# Patient Record
Sex: Female | Born: 2000 | Race: White | Hispanic: No | Marital: Single | Smoking: Never smoker
Health system: Southern US, Community
[De-identification: ages and names within clinical notes are randomized; demographics above are authoritative.]

## PROBLEM LIST (undated history)

## (undated) DIAGNOSIS — G43909 Migraine, unspecified, not intractable, without status migrainosus: Secondary | ICD-10-CM

## (undated) DIAGNOSIS — R000 Tachycardia, unspecified: Secondary | ICD-10-CM

## (undated) HISTORY — DX: Migraine, unspecified, not intractable, without status migrainosus: G43.909

## (undated) HISTORY — PX: NO PAST SURGERIES: SHX2092

## (undated) HISTORY — DX: Tachycardia, unspecified: R00.0

---
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
## 2022-01-16 NOTE — Progress Notes (Signed)
This encounter was created in error - please disregard.

## 2022-06-05 ENCOUNTER — Emergency Department (HOSPITAL_BASED_OUTPATIENT_CLINIC_OR_DEPARTMENT_OTHER)
Admission: EM | Admit: 2022-06-05 | Discharge: 2022-06-05 | Disposition: A | Discharge status: Home / Self Care | Payer: No Typology Code available for payment source | Attending: Emergency Medicine | Admitting: Emergency Medicine

## 2022-06-05 ENCOUNTER — Emergency Department (HOSPITAL_BASED_OUTPATIENT_CLINIC_OR_DEPARTMENT_OTHER): Payer: No Typology Code available for payment source | Admitting: Radiology

## 2022-06-05 ENCOUNTER — Emergency Department (HOSPITAL_BASED_OUTPATIENT_CLINIC_OR_DEPARTMENT_OTHER): Payer: No Typology Code available for payment source

## 2022-06-05 ENCOUNTER — Encounter (HOSPITAL_BASED_OUTPATIENT_CLINIC_OR_DEPARTMENT_OTHER): Payer: Self-pay | Admitting: *Deleted

## 2022-06-05 ENCOUNTER — Other Ambulatory Visit: Payer: Self-pay

## 2022-06-05 DIAGNOSIS — R002 Palpitations: Secondary | ICD-10-CM | POA: Diagnosis not present

## 2022-06-05 DIAGNOSIS — R0602 Shortness of breath: Secondary | ICD-10-CM | POA: Diagnosis not present

## 2022-06-05 DIAGNOSIS — Z91040 Latex allergy status: Secondary | ICD-10-CM | POA: Insufficient documentation

## 2022-06-05 DIAGNOSIS — Z79899 Other long term (current) drug therapy: Secondary | ICD-10-CM | POA: Diagnosis not present

## 2022-06-05 DIAGNOSIS — R109 Unspecified abdominal pain: Secondary | ICD-10-CM | POA: Diagnosis not present

## 2022-06-05 DIAGNOSIS — R000 Tachycardia, unspecified: Secondary | ICD-10-CM | POA: Diagnosis present

## 2022-06-05 LAB — URINALYSIS, ROUTINE W REFLEX MICROSCOPIC
Bilirubin Urine: NEGATIVE
Glucose, UA: NEGATIVE mg/dL
Hgb urine dipstick: NEGATIVE
Ketones, ur: NEGATIVE mg/dL
Leukocytes,Ua: NEGATIVE
Nitrite: NEGATIVE
Protein, ur: NEGATIVE mg/dL
Specific Gravity, Urine: 1.01 (ref 1.005–1.030)
pH: 6.5 (ref 5.0–8.0)

## 2022-06-05 LAB — BASIC METABOLIC PANEL
Anion gap: 11 (ref 5–15)
BUN: 9 mg/dL (ref 6–20)
CO2: 24 mmol/L (ref 22–32)
Calcium: 9.4 mg/dL (ref 8.9–10.3)
Chloride: 103 mmol/L (ref 98–111)
Creatinine, Ser: 0.78 mg/dL (ref 0.44–1.00)
GFR, Estimated: >60 mL/min (ref >60)
Glucose, Bld: 99 mg/dL (ref 70–99)
Potassium: 3.6 mmol/L (ref 3.5–5.1)
Sodium: 138 mmol/L (ref 135–145)

## 2022-06-05 LAB — TROPONIN I (HIGH SENSITIVITY): Troponin I (High Sensitivity): <2 ng/L (ref <18)

## 2022-06-05 LAB — TSH: TSH: 2.047 u[IU]/mL (ref 0.350–4.500)

## 2022-06-05 LAB — CBC
HCT: 43.4 % (ref 36.0–46.0)
Hemoglobin: 15 g/dL (ref 12.0–15.0)
MCH: 30.5 pg (ref 26.0–34.0)
MCHC: 34.6 g/dL (ref 30.0–36.0)
MCV: 88.2 fL (ref 80.0–100.0)
Platelets: 477 10*3/uL — ABNORMAL HIGH (ref 150–400)
RBC: 4.92 MIL/uL (ref 3.87–5.11)
RDW: 12.7 % (ref 11.5–15.5)
WBC: 9 10*3/uL (ref 4.0–10.5)
nRBC: 0 % (ref 0.0–0.2)

## 2022-06-05 LAB — HEPATIC FUNCTION PANEL
ALT: 15 U/L (ref 0–44)
AST: 20 U/L (ref 15–41)
Albumin: 4.5 g/dL (ref 3.5–5.0)
Alkaline Phosphatase: 55 U/L (ref 38–126)
Bilirubin, Direct: <0.1 mg/dL (ref 0.0–0.2)
Total Bilirubin: 0.4 mg/dL (ref 0.3–1.2)
Total Protein: 7.2 g/dL (ref 6.5–8.1)

## 2022-06-05 LAB — T4, FREE: Free T4: 0.94 ng/dL (ref 0.61–1.12)

## 2022-06-05 LAB — LIPASE, BLOOD: Lipase: 10 U/L — ABNORMAL LOW (ref 11–51)

## 2022-06-05 LAB — PREGNANCY, URINE: Preg Test, Ur: NEGATIVE

## 2022-06-05 LAB — LACTIC ACID, PLASMA: Lactic Acid, Venous: 1.1 mmol/L (ref 0.5–1.9)

## 2022-06-05 MED ORDER — SODIUM CHLORIDE 0.9 % IV BOLUS
2000.0000 mL | Freq: Once | INTRAVENOUS | Status: AC
  Administered 2022-06-05: 2000 mL via INTRAVENOUS

## 2022-06-05 MED ORDER — METOPROLOL TARTRATE 25 MG PO TABS: 12.5000 mg | ORAL_TABLET | Freq: Two times a day (BID) | ORAL | 0 refills | Status: DC | PRN

## 2022-06-05 MED ORDER — IOHEXOL 350 MG/ML SOLN
100.0000 mL | Freq: Once | INTRAVENOUS | Status: AC | PRN
  Administered 2022-06-05: 80 mL via INTRAVENOUS

## 2022-06-05 NOTE — ED Notes (Signed)
Two extra SST drawn and sent to lab at their request

## 2022-06-05 NOTE — Discharge Instructions (Addendum)
It was a pleasure taking care of you here in the emergency department  As discussed in the room we have started you on a low-dose beta-blocker.  This will be 1/2 tablet of the medication I prescribed.  You will take 1 dose in the morning to start with.  If you have persistently elevated heart rates in the evening you may take an additional dose.  Make sure to drink plenty of fluids, stop any caffeinated beverages, tobacco use or illicit substances if you use any.  I have placed a referral to the cardiologist.  They should call you within the week to schedule an appointment.  If you do not hear back from them by Wednesday please call to schedule an appointment.  At that time please let them know you are seen in the emergency department.  Return for new or worsening symptoms

## 2022-06-05 NOTE — ED Provider Notes (Signed)
[place] EMERGENCY DEPARTMENT AT [facility] Provider Note   CSN: [id number] Arrival date & time: 06/05/22  1411    History  Chief Complaint  Patient presents with   Tachycardia    Kari Villa is a 22 y.o. female with no significant past medical history here for evaluation of tachycardia.  Has been going on over the last 2 weeks.  Has been seen in urgent care multiple times per patient for similar.  Was recently bit by a tick 2 weeks ago, 1 to right upper back, 1 to right forearm.  She thinks 1 was a dog tick and the other 1 a take that.  Re[place] spotted fever.  She was seen by urgent care at that time and was started on doxycycline.  No fever, headache, nausea, vomiting.  She has abdominal pain and was constipated after starting the doxycycline she has been using mag citrate which has helped.  Now having normal bowel movements.  She denies any rashes or lesions.  States she has had persistent tachycardia, worse with up and moving around.  No history of similar.  No thyroid problems.  Drinks 1-2 sodas, Dr. Reino Kent daily.  No illicit substance use.  No stimulant drugs.  Has some shortness of breath and palpitations.  States her heart rate will come down to the 110-115 while laying there however up and moving around at work her heart rate will be in the 160s.  She denies any syncope.  No pain or swelling to lower extremities.  Denies additional aggravating or alleviating factors.  Does not have a PCP  HPI     Home Medications Prior to Admission medications   Medication Sig Start Date End Date Taking? Authorizing Provider  metoprolol tartrate (LOPRESSOR) 25 MG tablet Take 0.5 tablets (12.5 mg total) by mouth every 12 (twelve) hours as needed. 06/05/22  Yes Grettel Rames A, PA-C  JUNEL FE 1.5/30 1.5-30 MG-MCG tablet TAKE 1 TABLET BY MOUTH DAILY. TAKE ACTIVE PILLS ONLY, SKIPPING PLACEBOS PILLS 10/13/21   Wyline Beady A, NP      Allergies    Levofloxacin, Latex,  Penicillins, and Sulfa antibiotics    Review of Systems   Review of Systems  Constitutional: Negative.   HENT: Negative.    Respiratory:  Positive for shortness of breath.   Cardiovascular:  Positive for palpitations. Negative for chest pain and leg swelling.  Gastrointestinal: Negative.   Genitourinary: Negative.   Musculoskeletal: Negative.   Skin: Negative.   Neurological: Negative.   All other systems reviewed and are negative.   Physical Exam Updated Vital Signs BP (!) 105/90   Pulse (!) 103   Temp 99.1 F (37.3 C) (Oral)   Resp (!) 21   SpO2 98%  Physical Exam Vitals and nursing note reviewed.  Constitutional:      General: She is not in acute distress.    Appearance: She is well-developed. She is not ill-appearing or toxic-appearing.  HENT:     Head: Normocephalic and atraumatic.     Nose: Nose normal.     Mouth/Throat:     Mouth: Mucous membranes are moist.  Eyes:     Pupils: Pupils are equal, round, and reactive to light.  Cardiovascular:     Rate and Rhythm: Regular rhythm. Tachycardia present.     Pulses: Normal pulses.     Heart sounds: Normal heart sounds.  Pulmonary:     Effort: Pulmonary effort is normal. No respiratory distress.     Breath sounds: Normal breath  sounds.  Abdominal:     General: Bowel sounds are normal. There is no distension.     Palpations: Abdomen is soft.  Musculoskeletal:        General: No swelling, tenderness, deformity or signs of injury. Normal range of motion.     Cervical back: Normal range of motion.     Right lower leg: No edema.     Left lower leg: No edema.  Skin:    General: Skin is warm and dry.     Capillary Refill: Capillary refill takes less than 2 seconds.     Findings: No lesion or rash.  Neurological:     General: No focal deficit present.     Mental Status: She is alert and oriented to person, place, and time.     Motor: No weakness.     Gait: Gait normal.  Psychiatric:        Mood and Affect: Mood  normal.     ED Results / Procedures / Treatments   Labs (all labs ordered are listed, but only abnormal results are displayed) Labs Reviewed  CBC - Abnormal; Notable for the following components:      Result Value   Platelets 477 (*)    All other components within normal limits  LIPASE, BLOOD - Abnormal; Notable for the following components:   Lipase 10 (*)    All other components within normal limits  URINALYSIS, ROUTINE W REFLEX MICROSCOPIC - Abnormal; Notable for the following components:   Color, Urine COLORLESS (*)    All other components within normal limits  BASIC METABOLIC PANEL  PREGNANCY, URINE  TSH  HEPATIC FUNCTION PANEL  LACTIC ACID, PLASMA  T4, FREE  ROCKY MTN SPOTTED FVR ABS PNL(IGG+IGM)  LYME DISEASE SEROLOGY W/REFLEX  SPOTTED FEVER GROUP ANTIBODIES  TROPONIN I (HIGH SENSITIVITY)  TROPONIN I (HIGH SENSITIVITY)    EKG EKG Interpretation  Date/Time:  Friday Jun 05 2022 14:19:06 EDT Ventricular Rate:  132 PR Interval:  130 QRS Duration: 72 QT Interval:  302 QTC Calculation: 447 R Axis:   90 Text Interpretation: Sinus tachycardia Rightward axis T wave abnormality, consider inferior ischemia T wave abnormality, consider anterior ischemia Abnormal ECG No previous ECGs available Confirmed by Vonita Moss [phone]) on 06/05/2022 3:16:11 PM  Radiology CT Angio Chest PE W and/or Wo Contrast  Result Date: 06/05/2022 CLINICAL DATA:  Pulmonary embolism suspected, shortness of breath, tachycardia EXAM: CT ANGIOGRAPHY CHEST WITH CONTRAST TECHNIQUE: Multidetector CT imaging of the chest was performed using the standard protocol during bolus administration of intravenous contrast. Multiplanar CT image reconstructions and MIPs were obtained to evaluate the vascular anatomy. RADIATION DOSE REDUCTION: This exam was performed according to the departmental dose-optimization program which includes automated exposure control, adjustment of the mA and/or kV according to patient  size and/or use of iterative reconstruction technique. CONTRAST:  80mL OMNIPAQUE IOHEXOL 350 MG/ML SOLN COMPARISON:  None Available. FINDINGS: Cardiovascular: Satisfactory opacification of the pulmonary arteries to the segmental level. No evidence of pulmonary embolism. Normal heart size. No pericardial effusion. Mediastinum/Nodes: No enlarged mediastinal, hilar, or axillary lymph nodes. Thyroid gland, trachea, and esophagus demonstrate no significant findings. Likely residual thymus in the anterior mediastinum. Lungs/Pleura: Lungs are clear. No pleural effusion or pneumothorax. Upper Abdomen: No acute abnormality. Musculoskeletal: No chest wall abnormality. No acute osseous abnormality. Hemangioma in the T5 vertebral body. S-shaped curvature of the thoracolumbar spine. Review of the MIP images confirms the above findings. IMPRESSION: No evidence of pulmonary embolism or other acute intrathoracic  process. Electronically Signed   By: Wiliam Ke M.D.   On: 06/05/2022 16:14   DG Chest 2 View  Result Date: 06/05/2022 CLINICAL DATA:  Tachycardia, fever, fatigue, recent tick bite EXAM: CHEST - 2 VIEW COMPARISON:  None Available. FINDINGS: The heart size and mediastinal contours are within normal limits. Both lungs are clear. The visualized skeletal structures are unremarkable. IMPRESSION: No active cardiopulmonary disease. Electronically Signed   By: Ernie Avena M.D.   On: 06/05/2022 14:53    Procedures Procedures    Medications Ordered in ED Medications  sodium chloride 0.9 % bolus 2,000 mL (0 mLs Intravenous Stopped 06/05/22 1705)  iohexol (OMNIPAQUE) 350 MG/ML injection 100 mL (80 mLs Intravenous Contrast Given 06/05/22 1602)   ED Course/ Medical Decision Making/ A&P   22 year old here for evaluation of tachycardia.  Was found to have 2 tick bites approximately 2 weeks ago and started on doxycycline.  Since then has had persistent tachycardia, worse with movement.  Arrival she is afebrile,  nonseptic, not ill-appearing.  She has some persistent tachycardia into the 120s-130s in the room.  She is no clinical evidence of VTE.  Does admit to some intermittent shortness of breath and palpitations.  Denies any overt chest pain.  Normal p.o. intake.  No history of thyroid problems, she does drink 1-2 caffeinated beverages daily, no illicit substance use, EtOH use> Has not had a documented Lyme infection currently.  Will plan IV fluids, labs, imaging, reassess.  Labs and imaging personally viewed and interpreted:  CBC without leukocytosis Metabolic panel without significant abnormality Troponin less than 2 Pregnancy test negative UA negative for infection Lipase 10, TSH 2.047 [place] spotted fever, Lyme in process CTA negative for infection, edema, PE EKG sinus tachycardia  Patient reassessed. Has gotten 1 L IVF, Tachycardia improved however still some persistent tachycardia.  At this time low suspicion for sepsis, myocarditis, pericarditis, PE, pneumonia, dehydration, thyroid storm, lethal arrhythmia, infectious process as cause of her symptoms.  Discussed with attending, Dr. Jarold Motto.  Will start her on low-dose beta-blocker and have her follow-up with cardiology.  Encouraged return for new or worsening symptoms.  The patient has been appropriately medically screened and/or stabilized in the ED. I have low suspicion for any other emergent medical condition which would require further screening, evaluation or treatment in the ED or require inpatient management.  Patient is hemodynamically stable and in no acute distress.  Patient able to ambulate in department prior to ED.  Evaluation does not show acute pathology that would require ongoing or additional emergent interventions while in the emergency department or further inpatient treatment.  I have discussed the diagnosis with the patient and answered all questions.  Pain is been managed while in the emergency department and patient  has no further complaints prior to discharge.  Patient is comfortable with plan discussed in room and is stable for discharge at this time.  I have discussed strict return precautions for returning to the emergency department.  Patient was encouraged to follow-up with PCP/specialist refer to at discharge.                             Medical Decision Making Amount and/or Complexity of Data Reviewed Independent Historian: parent External Data Reviewed: labs, radiology, ECG and notes. Labs: ordered. Decision-making details documented in ED Course. Radiology: ordered and independent interpretation performed. Decision-making details documented in ED Course. ECG/medicine tests: ordered and independent interpretation performed. Decision-making details  documented in ED Course.  Risk OTC drugs. Prescription drug management. Parenteral controlled substances. Decision regarding hospitalization. Diagnosis or treatment significantly limited by social determinants of health.          Final Clinical Impression(s) / ED Diagnoses Final diagnoses:  Tachycardia    Rx / DC Orders ED Discharge Orders          Ordered    Ambulatory referral to Cardiology       Comments: If you have not heard from the Cardiology office within the next 72 hours please call [phone].   06/05/22 1834    metoprolol tartrate (LOPRESSOR) 25 MG tablet  Every 12 hours PRN        06/05/22 1836              Breon Diss A, PA-C 06/05/22 1851    Rondel Baton, MD 06/05/22 2350

## 2022-06-05 NOTE — ED Triage Notes (Signed)
Pt was bitten by a tick 2 weeks ago and was fatigued and had a low grade fever after this and was placed on 20mg  doxycycline for 30 days and also zofran due to nausea and vomiting.  Pt has continued to have fatigue and high HR. UCC felt that her tachycardia was related to constipation.  COnstipation is resolved and pt continues to have tachycardia.  No CP but feels like she has been running and slight sob.

## 2022-06-06 LAB — LYME DISEASE SEROLOGY W/REFLEX: Lyme Total Antibody EIA: NEGATIVE

## 2022-06-10 ENCOUNTER — Ambulatory Visit: Payer: No Typology Code available for payment source | Admitting: Cardiovascular Disease

## 2022-06-10 LAB — SPOTTED FEVER GROUP ANTIBODIES
Spotted Fever Group IgG: <1:64 {titer}
Spotted Fever Group IgM: <1:64 {titer}

## 2022-06-22 ENCOUNTER — Other Ambulatory Visit: Payer: Self-pay | Admitting: Nurse Practitioner

## 2022-06-22 DIAGNOSIS — Z3041 Encounter for surveillance of contraceptive pills: Secondary | ICD-10-CM

## 2022-06-22 DIAGNOSIS — N920 Excessive and frequent menstruation with regular cycle: Secondary | ICD-10-CM

## 2022-06-22 DIAGNOSIS — N946 Dysmenorrhea, unspecified: Secondary | ICD-10-CM

## 2022-07-06 ENCOUNTER — Other Ambulatory Visit: Payer: Self-pay

## 2022-07-06 DIAGNOSIS — N946 Dysmenorrhea, unspecified: Secondary | ICD-10-CM

## 2022-07-06 DIAGNOSIS — Z3041 Encounter for surveillance of contraceptive pills: Secondary | ICD-10-CM

## 2022-07-06 DIAGNOSIS — N920 Excessive and frequent menstruation with regular cycle: Secondary | ICD-10-CM

## 2022-07-06 MED ORDER — NORETHIN ACE-ETH ESTRAD-FE 1.5-30 MG-MCG PO TABS
ORAL_TABLET | ORAL | 0 refills | Status: DC
Start: 2022-07-06 — End: 2022-09-03

## 2022-07-06 NOTE — Telephone Encounter (Signed)
Pt LVM in triage line stating that she will need med refill prior to her scheduled appt.  Last AEX 09/02/2021--scheduled for 09/07/2022.  Rx pend.

## 2022-07-13 ENCOUNTER — Ambulatory Visit (INDEPENDENT_AMBULATORY_CARE_PROVIDER_SITE_OTHER): Payer: No Typology Code available for payment source

## 2022-07-13 ENCOUNTER — Encounter: Payer: Self-pay | Admitting: Internal Medicine

## 2022-07-13 ENCOUNTER — Ambulatory Visit: Payer: No Typology Code available for payment source | Attending: Internal Medicine | Admitting: Internal Medicine

## 2022-07-13 VITALS — BP 124/82 | HR 97 | Ht 67.0 in | Wt 159.0 lb

## 2022-07-13 DIAGNOSIS — R002 Palpitations: Secondary | ICD-10-CM

## 2022-07-13 DIAGNOSIS — R000 Tachycardia, unspecified: Secondary | ICD-10-CM

## 2022-07-13 MED ORDER — METOPROLOL TARTRATE 25 MG PO TABS: 12.5000 mg | ORAL_TABLET | Freq: Two times a day (BID) | ORAL | 3 refills | Status: DC | PRN

## 2022-07-13 MED ORDER — PROPRANOLOL HCL 20 MG PO TABS: 20.0000 mg | ORAL_TABLET | Freq: Two times a day (BID) | ORAL | 2 refills | Status: DC | PRN

## 2022-07-13 NOTE — Progress Notes (Signed)
Cardiology Office Note:    Date:  07/13/2022   ID:  Kari Villa, DOB 01/29/2000, MRN [id number]  PCP:  Patient, No Pcp Per   [facility] Providers Cardiologist:  None     Referring MD: Ralph Leyden A, PA-C   No chief complaint on file. Palpitations  History of Present Illness:    Kari Villa is a 22 y.o. female with a hx of of migraine. She notes heart racing and chest pain after being bit by a tick.  EKG shows sinus tachycardia, nl Qtc.  Past Medical History:  Diagnosis Date   Migraines     Past Surgical History:  Procedure Laterality Date   NO PAST SURGERIES      Current Medications: Current Outpatient Medications on File Prior to Visit  Medication Sig Dispense Refill   metoprolol tartrate (LOPRESSOR) 25 MG tablet Take 0.5 tablets (12.5 mg total) by mouth every 12 (twelve) hours as needed. 60 tablet 0   norethindrone-ethinyl estradiol-iron (JUNEL FE 1.5/30) 1.5-30 MG-MCG tablet TAKE 1 TABLET BY MOUTH DAILY. TAKE ACTIVE PILLS ONLY, SKIPPING PLACEBOS PILLS 84 tablet 0   No current facility-administered medications on file prior to visit.     Allergies:   Levofloxacin, Latex, Penicillins, and Sulfa antibiotics   Social History   Socioeconomic History   Marital status: Single    Spouse name: Not on file   Number of children: Not on file   Years of education: Not on file   Highest education level: Not on file  Occupational History   Not on file  Tobacco Use   Smoking status: Never   Smokeless tobacco: Never  Vaping Use   Vaping Use: Never used  Substance and Sexual Activity   Alcohol use: Not Currently   Drug use: Never   Sexual activity: Yes    Birth control/protection: Condom, Pill  Other Topics Concern   Not on file  Social History Narrative   Not on file   Social Determinants of Health   Financial Resource Strain: Not on file  Food Insecurity: Not on file  Transportation Needs: Not on file  Physical Activity: Not on file   Stress: Not on file  Social Connections: Not on file     Family History: The patient's family history includes Diabetes in her paternal grandmother; Esophageal cancer in her maternal grandfather; Lung cancer in her paternal grandfather.  ROS:   Please see the history of present illness.     All other systems reviewed and are negative.  EKGs/Labs/Other Studies Reviewed:    The following studies were reviewed today:       Recent Labs: 06/05/2022: ALT 15; BUN 9; Creatinine, Ser 0.78; Hemoglobin 15.0; Platelets 477; Potassium 3.6; Sodium 138; TSH 2.047   Recent Lipid Panel No results found for: "CHOL", "TRIG", "HDL", "CHOLHDL", "VLDL", "LDLCALC", "LDLDIRECT"   Risk Assessment/Calculations:     Physical Exam:    VS:  Vitals:   07/13/22 1401  BP: 124/82  Pulse: 97  SpO2: 98%    Wt Readings from Last 3 Encounters:  10/31/21 145 lb (65.8 kg) (76 %, Z= 0.69)*  10/24/21 145 lb (65.8 kg) (76 %, Z= 0.69)*  10/15/21 150 lb (68 kg) (80 %, Z= 0.86)*   * Growth percentiles are based on CDC (Girls, 2-20 Years) data.     GEN:  Well nourished, well developed in no acute distress HEENT: Normal CARDIAC: RRR, no murmurs, rubs, gallops RESPIRATORY:  Clear to auscultation without rales, wheezing or rhonchi  ABDOMEN:  Soft, non-tender, non-distended MUSCULOSKELETAL:  No edema; No deformity  SKIN: Warm and dry NEUROLOGIC:  Alert and oriented x 3 PSYCHIATRIC:  Normal affect   ASSESSMENT:    Palpitations She does not have high risk features including syncope c/f arrhythmia , family hx of SCD, or abnormalities on her EKG. Recommend hydration with electrolytes and cutting caffeine.    PLAN:    In order of problems listed above:  3 day ziopatch Follow up PRN           Medication Adjustments/Labs and Tests Ordered: Current medicines are reviewed at length with the patient today.  Concerns regarding medicines are outlined above.  No orders of the defined types were placed in  this encounter.  No orders of the defined types were placed in this encounter.   There are no Patient Instructions on file for this visit.   Signed, Maisie Fus, MD  07/13/2022 1:57 PM    [facility]

## 2022-07-13 NOTE — Progress Notes (Unsigned)
Enrolled for [employer] to mail a ZIO XT long term holter monitor to the patients address on file.  

## 2022-07-13 NOTE — Patient Instructions (Signed)
Medication Instructions:  CONTINUE metoprolol tartrate as needed - refills sent to pharmacy   *If you need a refill on your cardiac medications before your next appointment, please call your pharmacy*  Testing/Procedures: ZIO AT Long term monitor-Live Telemetry  Your physician has requested you wear a ZIO patch monitor for 3 days.  This is a single patch monitor. [employer] supplies one patch monitor per enrollment. Additional  stickers are not available.  Please do not apply patch if you will be having a Nuclear Stress Test, Echocardiogram, Cardiac CT, MRI,  or Chest Xray during the period you would be wearing the monitor. The patch cannot be worn during  these tests. You cannot remove and re-apply the ZIO AT patch monitor.  Your ZIO patch monitor will be mailed 3 day USPS to your address on file. It may take 3-5 days to  receive your monitor after you have been enrolled.  Once you have received your monitor, please review the enclosed instructions. Your monitor has  already been registered assigning a specific monitor serial # to you.   Billing and Patient Assistance Program information  Meredeth Ide has been supplied with any insurance information on record for billing. [employer] offers a sliding scale Patient Assistance Program for patients without insurance, or whose  insurance does not completely cover the cost of the ZIO patch monitor. You must apply for the  Patient Assistance Program to qualify for the discounted rate. To apply, call [employer] at [phone],  select option 4, select option 2 , ask to apply for the Patient Assistance Program, (you can request an  interpreter if needed). [employer] will ask your household income and how many people are in your  household. [employer] will quote your out-of-pocket cost based on this information. They will also be able  to set up a 12 month interest free payment plan if needed.  Applying the monitor   Shave hair from upper left chest.  Hold the  abrader disc by orange tab. Rub the abrader in 40 strokes over left upper chest as indicated in  your monitor instructions.  Clean area with 4 enclosed alcohol pads. Use all pads to ensure the area is cleaned thoroughly. Let  dry.  Apply patch as indicated in monitor instructions. Patch will be placed under collarbone on left side of  chest with arrow pointing upward.  Rub patch adhesive wings for 2 minutes. Remove the white label marked "1". Remove the white label  marked "2". Rub patch adhesive wings for 2 additional minutes.  While looking in a mirror, press and release button in center of patch. A small green light will flash 3-4  times. This will be your only indicator that the monitor has been turned on.  Do not shower for the first 24 hours. You may shower after the first 24 hours.  Press the button if you feel a symptom. You will hear a small click. Record Date, Time and Symptom in  the Patient Log.   Starting the Gateway  In your kit there is a Audiological scientist box the size of a cellphone. This is Buyer, retail. It transmits all your  recorded data to [facility]. This box must always stay within 10 feet of you. Open the box and push the *  button. There will be a light that blinks orange and then green a few times. When the light stops  blinking, the Gateway is connected to the ZIO patch. Call [employer] at [phone] to confirm your monitor is transmitting.  Returning your monitor  Remove your patch and place it inside the Gateway. In the lower half of the Gateway there is a white  bag with prepaid postage on it. Place Gateway in bag and seal. Mail package back to [place] as soon as  possible. Your physician should have your final report approximately 7 days after you have mailed back  your monitor. Call [facility] Customer Care at [phone] if you have questions regarding your ZIO AT  patch monitor. Call them immediately if you see an orange light blinking on your  monitor.  If your monitor falls off in less than 4 days, contact our Monitor department at [phone]. If your  monitor becomes loose or falls off after 4 days call [employer] at [phone] for suggestions on  securing your monitor     Follow-Up: At [facility], you and your health needs are our priority.  As part of our continuing mission to provide you with exceptional heart care, we have created designated Provider Care Teams.  These Care Teams include your primary Cardiologist (physician) and Advanced Practice Providers (APPs -  Physician Assistants and Nurse Practitioners) who all work together to provide you with the care you need, when you need it.  We recommend signing up for the patient portal called "MyChart".  Sign up information is provided on this After Visit Summary.  MyChart is used to connect with patients for Virtual Visits (Telemedicine).  Patients are able to view lab/test results, encounter notes, upcoming appointments, etc.  Non-urgent messages can be sent to your provider as well.   To learn more about what you can do with MyChart, go to [email or web address].    Your next appointment:    AS NEEDED with Dr. Wyline Mood

## 2022-07-20 ENCOUNTER — Ambulatory Visit: Payer: No Typology Code available for payment source | Admitting: Internal Medicine

## 2022-07-20 DIAGNOSIS — R002 Palpitations: Secondary | ICD-10-CM | POA: Diagnosis not present

## 2022-08-30 ENCOUNTER — Other Ambulatory Visit: Payer: Self-pay | Admitting: Nurse Practitioner

## 2022-08-30 DIAGNOSIS — N946 Dysmenorrhea, unspecified: Secondary | ICD-10-CM

## 2022-08-30 DIAGNOSIS — N920 Excessive and frequent menstruation with regular cycle: Secondary | ICD-10-CM

## 2022-08-30 DIAGNOSIS — Z3041 Encounter for surveillance of contraceptive pills: Secondary | ICD-10-CM

## 2022-09-01 NOTE — Telephone Encounter (Addendum)
Med refill request:Junel Fe 1.5/30 -skips placebo pills Last AEX: 09/02/21 -TW Next AEX: 09/16/22 -TW Last MMG (if hormonal med) N/A  Last RX sent 07/06/22 #84/0RF Rx refused.   Routing to provider FYI.   Encounter closed.

## 2022-09-03 ENCOUNTER — Other Ambulatory Visit: Payer: Self-pay | Admitting: Nurse Practitioner

## 2022-09-03 DIAGNOSIS — N946 Dysmenorrhea, unspecified: Secondary | ICD-10-CM

## 2022-09-03 DIAGNOSIS — Z3041 Encounter for surveillance of contraceptive pills: Secondary | ICD-10-CM

## 2022-09-03 DIAGNOSIS — N920 Excessive and frequent menstruation with regular cycle: Secondary | ICD-10-CM

## 2022-09-03 NOTE — Telephone Encounter (Signed)
Medication refill request: junel fe 1.5/30 Last AEX:  09-02-21 Next AEX: 09-16-22 Last MMG (if hormonal medication request): none Refill authorized: please approve if appropriate

## 2022-09-07 ENCOUNTER — Ambulatory Visit: Payer: No Typology Code available for payment source | Admitting: Nurse Practitioner

## 2022-09-10 NOTE — Telephone Encounter (Signed)
Pt LVM in triage line on 09/09/2022 @ 1604pm requesting prescription refill.   LVMTCB

## 2022-09-10 NOTE — Telephone Encounter (Signed)
Pt cb and she was advised of rx that was sent on 09/03/2022.   Pt advised to either speak with someone in person on phone at pharmacy or she may have to go to pharmacy and ask them to fill rx since sent in and would be listed under new rx number. Pt voiced understanding and appreciation for call. Routing to provider for final review.

## 2022-09-16 ENCOUNTER — Ambulatory Visit (INDEPENDENT_AMBULATORY_CARE_PROVIDER_SITE_OTHER): Payer: No Typology Code available for payment source | Admitting: Nurse Practitioner

## 2022-09-16 VITALS — BP 120/74 | HR 69 | Ht 67.0 in | Wt 160.0 lb

## 2022-09-16 DIAGNOSIS — N920 Excessive and frequent menstruation with regular cycle: Secondary | ICD-10-CM

## 2022-09-16 DIAGNOSIS — N946 Dysmenorrhea, unspecified: Secondary | ICD-10-CM | POA: Diagnosis not present

## 2022-09-16 DIAGNOSIS — Z3041 Encounter for surveillance of contraceptive pills: Secondary | ICD-10-CM | POA: Diagnosis not present

## 2022-09-16 DIAGNOSIS — Z01419 Encounter for gynecological examination (general) (routine) without abnormal findings: Secondary | ICD-10-CM

## 2022-09-16 MED ORDER — DROSPIRENONE-ETHINYL ESTRADIOL 3-0.02 MG PO TABS
1.0000 | ORAL_TABLET | Freq: Every day | ORAL | 3 refills | Status: DC
Start: 2022-09-16 — End: 2023-08-30

## 2022-09-16 NOTE — Progress Notes (Signed)
   Kari Villa 2000/05/11 [id number]   History:  22 y.o. G0 presents for annual exam. COCs for contraception and management of heavy, painful menses. Takes continuously but does experience spotting every 2-3 months, she will then take placebo week and then restart active pills. When she does have menses they are very heavy and painful. Can no longer take NSAIDs per cardiology due to tachycardia. Wondering what she can do for this now. Tachycardia thought to be from tick bite as this started right after. Saw cardiology in June, on Metoprolol. Family history of heart disease. Plans to get second opinion.   Gynecologic History Patient's last menstrual period was 09/13/2022.   Contraception/Family planning: OCP (estrogen/progesterone) Sexually active: Yes, declines STD screening  Health Maintenance Last Pap: Not indicated Last mammogram: Not indicated Last colonoscopy: Not indicated Last Dexa: Not indicated  Past medical history, past surgical history, family history and social history were all reviewed and documented in the EPIC chart. At [facility] with plans for nursing. 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   ROS:  A ROS was performed and pertinent positives and negatives are included.  Exam:  Vitals:   09/16/22 1159  BP: 120/74  Pulse: 69  SpO2: 100%  Weight: 160 lb (72.6 kg)  Height: 5\' 7"  (1.702 m)     Body mass index is 25.06 kg/m.  General appearance:  Normal Thyroid:  Symmetrical, normal in size, without palpable masses or nodularity. Respiratory  Auscultation:  Clear without wheezing or rhonchi Cardiovascular  Auscultation:  Regular rate, without rubs, murmurs or gallops  Edema/varicosities:  Not grossly evident Abdominal  Soft,nontender, without masses, guarding or rebound.  Liver/spleen:  No organomegaly noted  Hernia:  None appreciated  Skin  Inspection:  Grossly normal   Breasts: Not indicated per guidelines Genitourinary   Inguinal/mons:  Normal without  inguinal adenopathy  External genitalia:  Normal appearing vulva with no masses, tenderness, or lesions  BUS/Urethra/Skene's glands:  Normal  Vagina:  Normal appearing with normal color and discharge, no lesions  Cervix:  Normal appearing without discharge or lesions  Uterus:  Normal in size, shape and contour.  Midline and mobile, nontender  Adnexa/parametria:     Rt: Normal in size, without masses or tenderness.   Lt: Normal in size, without masses or tenderness.  Anus and perineum: Normal  Digital rectal exam: Not indicated  Patient informed chaperone available to be present for breast and pelvic exam. Patient has requested no chaperone to be present. Patient has been advised what will be completed during breast and pelvic exam.   Assessment/Plan:  22 y.o. G0 for annual exam.   Well female exam with routine gynecological exam - Education provided on SBEs, importance of preventative screenings, current guidelines, high calcium diet, regular exercise, and multivitamin daily.   Encounter for surveillance of contraceptive pills - Plan: drospirenone-ethinyl estradiol (NIKKI) 3-0.02 MG tablet daily. Taking as prescribed.   Menorrhagia with regular cycle - Plan: drospirenone-ethinyl estradiol (NIKKI) 3-0.02 MG tablet daily. Complains of very heavy menses when she does have them. Option to switch pill or switch to different birth control method if she prefers. She would like to try different pill.   Dysmenorrhea - Plan: drospirenone-ethinyl estradiol (NIKKI) 3-0.02 MG tablet daily. Cannot take NSAIDs anymore. Tylenol, heat.   Return in 1 year for annual.      Olivia Mackie [facility], 12:34 PM 09/16/2022

## 2022-11-12 ENCOUNTER — Other Ambulatory Visit: Payer: Self-pay | Admitting: Internal Medicine

## 2023-08-28 ENCOUNTER — Other Ambulatory Visit: Payer: Self-pay | Admitting: Nurse Practitioner

## 2023-08-28 DIAGNOSIS — N946 Dysmenorrhea, unspecified: Secondary | ICD-10-CM

## 2023-08-28 DIAGNOSIS — Z3041 Encounter for surveillance of contraceptive pills: Secondary | ICD-10-CM

## 2023-08-28 DIAGNOSIS — N920 Excessive and frequent menstruation with regular cycle: Secondary | ICD-10-CM

## 2023-08-30 NOTE — Telephone Encounter (Signed)
.  Med refill request: Kari Villa  Last AEX: 09/16/22 Next AEX: 09/21/23 Last MMG (if hormonal med) Refill authorized: Please Advise?

## 2023-09-21 ENCOUNTER — Encounter: Payer: Self-pay | Admitting: Nurse Practitioner

## 2023-09-21 ENCOUNTER — Other Ambulatory Visit (HOSPITAL_COMMUNITY)
Admission: RE | Admit: 2023-09-21 | Discharge: 2023-09-21 | Disposition: A | Discharge status: Home / Self Care | Source: Ambulatory Visit | Attending: Nurse Practitioner | Admitting: Nurse Practitioner

## 2023-09-21 ENCOUNTER — Ambulatory Visit (INDEPENDENT_AMBULATORY_CARE_PROVIDER_SITE_OTHER): Payer: No Typology Code available for payment source | Admitting: Nurse Practitioner

## 2023-09-21 VITALS — BP 124/84 | HR 110 | Ht 67.0 in | Wt 164.0 lb

## 2023-09-21 DIAGNOSIS — N920 Excessive and frequent menstruation with regular cycle: Secondary | ICD-10-CM | POA: Diagnosis not present

## 2023-09-21 DIAGNOSIS — Z30015 Encounter for initial prescription of vaginal ring hormonal contraceptive: Secondary | ICD-10-CM

## 2023-09-21 DIAGNOSIS — N946 Dysmenorrhea, unspecified: Secondary | ICD-10-CM | POA: Diagnosis not present

## 2023-09-21 DIAGNOSIS — Z124 Encounter for screening for malignant neoplasm of cervix: Secondary | ICD-10-CM

## 2023-09-21 DIAGNOSIS — Z1331 Encounter for screening for depression: Secondary | ICD-10-CM | POA: Diagnosis not present

## 2023-09-21 DIAGNOSIS — Z01419 Encounter for gynecological examination (general) (routine) without abnormal findings: Secondary | ICD-10-CM | POA: Diagnosis present

## 2023-09-21 DIAGNOSIS — Z3009 Encounter for other general counseling and advice on contraception: Secondary | ICD-10-CM

## 2023-09-21 MED ORDER — ANNOVERA 0.013-0.15 MG/24HR VA RING: 1.0000 | VAGINAL_RING | VAGINAL | 0 refills | Status: AC

## 2023-09-21 NOTE — Progress Notes (Signed)
 Kari Villa [birth year] [id number]   History:  23 y.o. G0 presents for annual exam. COCs for contraception and management of heavy, painful menses. Takes continuously but is experiencing spotting about every 2 weeks. Has tried a couple of different pills and BTB always starts after using for a while. Wants to discuss other options. NSAIDs contraindicated per cardiology due to tachycardia. Tachycardia thought to be from tick bite as this started right after. Saw cardiology in June, on Metoprolol . Family history of heart disease.   Gynecologic History Patient's last menstrual period was 07/26/2023 (approximate). Period Duration (Days): 16 Period Pattern: (!) Irregular Menstrual Flow: Light Menstrual Control: Panty liner, Thin pad Menstrual Control Change Freq (Hours): 2-3 Dysmenorrhea: (!) Mild Dysmenorrhea Symptoms: Cramping, Throbbing, Nausea, Headache Contraception/Family planning: OCP (estrogen/progesterone) Sexually active: Yes, declines STD screening  Health Maintenance Last Pap: Never Last mammogram: Not indicated Last colonoscopy: Not indicated Last Dexa: Not indicated     09/21/2023    1:40 PM  Depression screen PHQ 2/9  Decreased Interest 0  Down, Depressed, Hopeless 0  PHQ - 2 Score 0     Past medical history, past surgical history, family history and social history were all reviewed and documented in the EPIC chart. 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   ROS:  A ROS was performed and pertinent positives and negatives are included.  Exam:  Vitals:   09/21/23 1332  BP: 124/84  Pulse: (!) 110  SpO2: 99%  Weight: 164 lb (74.4 kg)  Height: 5' 7 (1.702 m)      Body mass index is 25.69 kg/m.  General appearance:  Normal Thyroid :  Symmetrical, normal in size, without palpable masses or nodularity. Respiratory  Auscultation:  Clear without wheezing or rhonchi Cardiovascular  Auscultation:  Regular rate, without rubs, murmurs or  gallops  Edema/varicosities:  Not grossly evident Abdominal  Soft,nontender, without masses, guarding or rebound.  Liver/spleen:  No organomegaly noted  Hernia:  None appreciated  Skin  Inspection:  Grossly normal Breasts: Examined lying and sitting.   Right: Without masses, retractions, nipple discharge or axillary adenopathy.   Left: Without masses, retractions, nipple discharge or axillary adenopathy. Pelvic: External genitalia:  no lesions              Urethra:  normal appearing urethra with no masses, tenderness or lesions              Bartholins and Skenes: normal                 Vagina: normal appearing vagina with normal color and discharge, no lesions              Cervix: no lesions Bimanual Exam:  Uterus:  no masses or tenderness              Adnexa: no mass, fullness, tenderness              Rectovaginal: Deferred              Anus:  normal, no lesions  Dereck Keas, CMA present as chaperone.   Assessment/Plan:  23 y.o. G0 for annual exam.   Well female exam with routine gynecological exam - Education provided on SBEs, importance of preventative screenings, current guidelines, high calcium diet, regular exercise, and multivitamin daily.   Cervical cancer screening - Plan: Cytology - PAP( [place]). Initial pap today.   Dysmenorrhea - Plan: Segesterone-Ethinyl Estradiol  (ANNOVERA ) 0.15-0.013 MG/24HR RING. Use continuously. Good for 13  months. Educated on proper use. If she has spotting remove for 3-4 days then reinsert.   Menorrhagia with regular cycle - Plan: Segesterone-Ethinyl Estradiol  (ANNOVERA ) 0.15-0.013 MG/24HR RING.   Encounter for initial prescription of vaginal ring hormonal contraceptive - Plan: Segesterone-Ethinyl Estradiol  (ANNOVERA ) 0.15-0.013 MG/24HR RING  General counseling and advice on female contraception - Contraceptive options were reviewed, including hormonal methods, both combination (pill, patch, vaginal ring) and progesterone-only (pill, Depo  Provera and Nexplanon), intrauterine devices (Mirena, Kyleena, Skyla, and Paraguard), Phexxi, barrier methods (condoms, diaphragm) and female/female sterilization. The mechanisms, risks, benefits and side effects of all methods were discussed.   Return in about 1 year (around 09/20/2024) for Annual.     Annabella DELENA [facility], 2:04 PM 09/21/2023

## 2023-09-23 ENCOUNTER — Ambulatory Visit: Payer: Self-pay | Admitting: Nurse Practitioner

## 2023-09-23 LAB — CYTOLOGY - PAP: Diagnosis: NEGATIVE

## 2023-12-20 ENCOUNTER — Other Ambulatory Visit: Payer: Self-pay

## 2023-12-20 ENCOUNTER — Emergency Department (HOSPITAL_BASED_OUTPATIENT_CLINIC_OR_DEPARTMENT_OTHER)
Admission: EM | Admit: 2023-12-20 | Discharge: 2023-12-20 | Disposition: A | Discharge status: Home / Self Care | Attending: Emergency Medicine | Admitting: Emergency Medicine

## 2023-12-20 DIAGNOSIS — R000 Tachycardia, unspecified: Secondary | ICD-10-CM | POA: Insufficient documentation

## 2023-12-20 DIAGNOSIS — R112 Nausea with vomiting, unspecified: Secondary | ICD-10-CM | POA: Insufficient documentation

## 2023-12-20 DIAGNOSIS — Z91040 Latex allergy status: Secondary | ICD-10-CM | POA: Insufficient documentation

## 2023-12-20 DIAGNOSIS — R55 Syncope and collapse: Secondary | ICD-10-CM | POA: Insufficient documentation

## 2023-12-20 LAB — COMPREHENSIVE METABOLIC PANEL WITH GFR
ALT: 31 U/L (ref 0–44)
AST: 32 U/L (ref 15–41)
Albumin: 4.4 g/dL (ref 3.5–5.0)
Alkaline Phosphatase: 94 U/L (ref 38–126)
Anion gap: 12 (ref 5–15)
BUN: 8 mg/dL (ref 6–20)
CO2: 25 mmol/L (ref 22–32)
Calcium: 9.7 mg/dL (ref 8.9–10.3)
Chloride: 101 mmol/L (ref 98–111)
Creatinine, Ser: 0.7 mg/dL (ref 0.44–1.00)
GFR, Estimated: >60 mL/min (ref >60)
Glucose, Bld: 98 mg/dL (ref 70–99)
Potassium: 3.5 mmol/L (ref 3.5–5.1)
Sodium: 138 mmol/L (ref 135–145)
Total Bilirubin: 0.5 mg/dL (ref 0.0–1.2)
Total Protein: 7.4 g/dL (ref 6.5–8.1)

## 2023-12-20 LAB — URINALYSIS, ROUTINE W REFLEX MICROSCOPIC
Bacteria, UA: NONE SEEN
Bilirubin Urine: NEGATIVE
Glucose, UA: NEGATIVE mg/dL
Hgb urine dipstick: NEGATIVE
Ketones, ur: 15 mg/dL — AB
Leukocytes,Ua: NEGATIVE
Nitrite: NEGATIVE
Protein, ur: NEGATIVE mg/dL
Specific Gravity, Urine: 1.018 (ref 1.005–1.030)
pH: 7.5 (ref 5.0–8.0)

## 2023-12-20 LAB — CBC
HCT: 40.8 % (ref 36.0–46.0)
Hemoglobin: 14.3 g/dL (ref 12.0–15.0)
MCH: 29.8 pg (ref 26.0–34.0)
MCHC: 35 g/dL (ref 30.0–36.0)
MCV: 85 fL (ref 80.0–100.0)
Platelets: 393 K/uL (ref 150–400)
RBC: 4.8 MIL/uL (ref 3.87–5.11)
RDW: 12.6 % (ref 11.5–15.5)
WBC: 8.4 K/uL (ref 4.0–10.5)
nRBC: 0 % (ref 0.0–0.2)

## 2023-12-20 LAB — RESP PANEL BY RT-PCR (RSV, FLU A&B, COVID)  RVPGX2
Influenza A by PCR: NEGATIVE
Influenza B by PCR: NEGATIVE
Resp Syncytial Virus by PCR: NEGATIVE
SARS Coronavirus 2 by RT PCR: NEGATIVE

## 2023-12-20 LAB — URINE DRUG SCREEN
Amphetamines: NEGATIVE
Barbiturates: NEGATIVE
Benzodiazepines: NEGATIVE
Cocaine: NEGATIVE
Fentanyl: NEGATIVE
Methadone Scn, Ur: NEGATIVE
Opiates: NEGATIVE
Tetrahydrocannabinol: NEGATIVE

## 2023-12-20 LAB — PREGNANCY, URINE: Preg Test, Ur: NEGATIVE

## 2023-12-20 LAB — LIPASE, BLOOD: Lipase: 30 U/L (ref 11–51)

## 2023-12-20 MED ORDER — ONDANSETRON 4 MG PO TBDP: 4.0000 mg | ORAL_TABLET | Freq: Three times a day (TID) | ORAL | 0 refills | Status: AC | PRN

## 2023-12-20 MED ORDER — ONDANSETRON HCL 4 MG/2ML IJ SOLN
4.0000 mg | Freq: Four times a day (QID) | INTRAMUSCULAR | Status: DC | PRN
  Administered 2023-12-20: 4 mg via INTRAVENOUS
  Filled 2023-12-20: qty 2

## 2023-12-20 MED ORDER — SODIUM CHLORIDE 0.9 % IV BOLUS
1000.0000 mL | Freq: Once | INTRAVENOUS | Status: AC
  Administered 2023-12-20: 1000 mL via INTRAVENOUS

## 2023-12-20 MED ORDER — PROMETHAZINE HCL 25 MG RE SUPP: 25.0000 mg | Freq: Three times a day (TID) | RECTAL | 0 refills | Status: AC | PRN

## 2023-12-20 NOTE — ED Provider Notes (Signed)
 [place] EMERGENCY DEPARTMENT AT [facility] Provider Note   CSN: [id number] Arrival date & time: 12/20/23  2035     Patient presents with: Loss of Consciousness   Kari Villa is a 23 y.o. female presented to the ER with complaint of loss of consciousness and nausea vomiting.  Patient reports that 3 days ago when she was standing at the window watching her dog she became very woozy and lost consciousness.  Her husband at the bedside reports that he saw her sag down from standing, reports that she seemed confused for a little while afterwards, but there was no tonic-clonic shaking activity, no foaming at the mouth, no prolonged confusion afterwards.  She says she has never passed out before.  She says she was feeling under the weather and lightheaded for the past 2 days.  Today specifically she became nauseated and had 2 episodes of vomiting.  She continues to feel mildly nauseous, lightheaded.  She denies any chest pain or pressure.  Of note, reviewed her external records and she was evaluated for tachycardia about a year ago.  She wore a Zio patch which noted that she had some tachycardic episodes but no dangerous arrhythmia.  She was on metoprolol  for period of months but ran out of her prescription because she reports that her cardiologist moved practices.  She said the metoprolol  made her feel very sluggish.   HPI     Prior to Admission medications   Medication Sig Start Date End Date Taking? Authorizing Provider  ondansetron (ZOFRAN-ODT) 4 MG disintegrating tablet Take 1 tablet (4 mg total) by mouth every 8 (eight) hours as needed for up to 15 doses for vomiting or nausea. 12/20/23  Yes Ayleah Hofmeister, Donnice PARAS, MD  promethazine (PHENERGAN) 25 MG suppository Place 1 suppository (25 mg total) rectally every 8 (eight) hours as needed for up to 6 doses for refractory nausea / vomiting. 12/20/23  Yes Cottie Donnice PARAS, MD  metoprolol  tartrate (LOPRESSOR ) 25 MG tablet TAKE 0.5 TABLETS  (12.5 MG TOTAL) BY MOUTH 2 (TWO) TIMES DAILY AS NEEDED. 11/13/22 09/21/23  Alvan Ronal BRAVO, MD  Segesterone-Ethinyl Estradiol  (ANNOVERA ) 0.15-0.013 MG/24HR RING Place 1 Ring vaginally every 30 (thirty) days. 09/21/23   Prentiss Annabella LABOR, NP    Allergies: Levofloxacin, Latex, Penicillins, and Sulfa antibiotics    Review of Systems  Updated Vital Signs BP (!) 138/105   Pulse (!) 101   Temp 98.7 F (37.1 C) (Oral)   Resp 18   Ht 5' 7 (1.702 m)   Wt 74.8 kg   SpO2 100%   BMI 25.84 kg/m   Physical Exam Constitutional:      General: She is not in acute distress. HENT:     Head: Normocephalic and atraumatic.  Eyes:     Conjunctiva/sclera: Conjunctivae normal.     Pupils: Pupils are equal, round, and reactive to light.  Cardiovascular:     Rate and Rhythm: Regular rhythm. Tachycardia present.  Pulmonary:     Effort: Pulmonary effort is normal. No respiratory distress.  Abdominal:     General: There is no distension.     Tenderness: There is no abdominal tenderness.  Skin:    General: Skin is warm and dry.  Neurological:     General: No focal deficit present.     Mental Status: She is alert. Mental status is at baseline.  Psychiatric:        Mood and Affect: Mood normal.        Behavior: Behavior  normal.     (all labs ordered are listed, but only abnormal results are displayed) Labs Reviewed  URINALYSIS, ROUTINE W REFLEX MICROSCOPIC - Abnormal; Notable for the following components:      Result Value   APPearance HAZY (*)    Ketones, ur 15 (*)    All other components within normal limits  RESP PANEL BY RT-PCR (RSV, FLU A&B, COVID)  RVPGX2  COMPREHENSIVE METABOLIC PANEL WITH GFR  CBC  PREGNANCY, URINE  LIPASE, BLOOD  URINE DRUG SCREEN  CBG MONITORING, ED    EKG: EKG Interpretation Date/Time:  Monday December 20 2023 20:56:48 EST Ventricular Rate:  108 PR Interval:  124 QRS Duration:  79 QT Interval:  316 QTC Calculation: 424 R Axis:   92  Text  Interpretation: Sinus tachycardia Borderline right axis deviation Borderline repolarization abnormality Confirmed by Cottie Cough [phone]) on 12/20/2023 9:09:36 PM  Radiology: No results found.   Procedures   Medications Ordered in the ED  ondansetron [facility]) injection 4 mg (4 mg Intravenous Given 12/20/23 2211)  sodium chloride  0.9 % bolus 1,000 mL (1,000 mLs Intravenous New Bag/Given 12/20/23 2205)    Clinical Course as of 12/20/23 2312  Mon Dec 20, 2023  2203 Patient is vomiting, Zofran ordered [MT]    Clinical Course User Index [MT] Cottie Cough PARAS, MD                                 Medical Decision Making Amount and/or Complexity of Data Reviewed Labs: ordered.  Risk Prescription drug management.   This patient presents to the ED with concern for near syncope, lightheadedness, nausea vomit. This involves an extensive number of treatment options, and is a complaint that carries with it a high risk of complications and morbidity.  The differential diagnosis includes viral syndrome including COVID-19 or influenza versus intra-abdominal process versus arrhythmia versus other  External records from outside source obtained and reviewed including cardiology outpatient records and Zio patch report  I ordered and personally interpreted labs.  The pertinent results include: No emergent findings The patient was maintained on a cardiac monitor.  I personally viewed and interpreted the cardiac monitored which showed an underlying rhythm of: Mild baseline sinus tachycardia  Per my interpretation the patient's ECG shows sinus tachycardia with no acute ischemia  I ordered medication including IV fluids for hydration IV Zofran for nausea  I have reviewed the patients home medicines and have made adjustments as needed  Test Considered: Will lower suspicion for acute PE.  The patient is not hypoxic.  She does not have risk factors for PE other than intermittent estrogen use.  Her  tachycardia is chronic and ongoing in the setting of no longer being on metoprolol  for rate control.  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  The patient does not have any abdominal tenderness on exam or complaint of abdominal pain.  I have a low suspicion for acute cholecystitis, appendicitis, other inflammatory infectious emergency process.  Do not believe that we need abdominal imaging.  Low suspicion for pelvic pathology.   After the interventions noted above, I reevaluated the patient and found that they have: improved -she felt better after fluid and nausea medications and was able to tolerate p.o.  We did discuss the option of observation stay in the hospital and possible echocardiogram versus outpatient follow-up with cardiology.  They would prefer to go home and I think this is reasonable.  Provide nausea control at home.  I explained I think this is likely a viral syndrome, recommended that she stay home and rest and keep hydrated.  If she has recurring syncope, inability to tolerate p.o., her family will return her to the ED.  She lives with her husband.  They are in agreement with this plan.  I did place another referral to hopefully expedite a follow-up appointment with cardiology, as she says she has not been seen in several months.  Disposition:  After consideration of the diagnostic results and the patients response to treatment, I feel that the patent would benefit from close outpatient follow-up.      Final diagnoses:  Nausea and vomiting, unspecified vomiting type  Near syncope    ED Discharge Orders          Ordered    Ambulatory referral to Cardiology       Comments: Near syncope - referral for consideration of echocardiogram   12/20/23 2310    ondansetron (ZOFRAN-ODT) 4 MG disintegrating tablet  Every 8 hours PRN        12/20/23 2311    promethazine (PHENERGAN) 25 MG suppository  Every 8 hours PRN        12/20/23 2311               Cottie Donnice PARAS, MD 12/20/23 2312

## 2023-12-20 NOTE — ED Triage Notes (Signed)
 Pt POV reporting multiple episodes of syncope and collapse since Friday, per pt she experiences tremors after events, denies seizure hx.

## 2023-12-22 ENCOUNTER — Encounter: Payer: Self-pay | Admitting: Internal Medicine

## 2023-12-22 ENCOUNTER — Ambulatory Visit: Attending: Internal Medicine | Admitting: Internal Medicine

## 2023-12-22 VITALS — Ht 67.0 in | Wt 166.8 lb

## 2023-12-22 DIAGNOSIS — R002 Palpitations: Secondary | ICD-10-CM

## 2023-12-22 DIAGNOSIS — G90A Postural orthostatic tachycardia syndrome (POTS): Secondary | ICD-10-CM | POA: Diagnosis not present

## 2023-12-22 DIAGNOSIS — R55 Syncope and collapse: Secondary | ICD-10-CM

## 2023-12-22 MED ORDER — PROPRANOLOL HCL 10 MG PO TABS: 10.0000 mg | ORAL_TABLET | Freq: Three times a day (TID) | ORAL | Status: DC

## 2023-12-22 NOTE — Progress Notes (Signed)
 Cardiology Office Note   Date:  12/22/2023  ID:  Kari Villa, DOB [birth year], MRN [id number] PCP: Patient, No Pcp Per   [facility] Providers Cardiologist:  Emeline FORBES Calender, DO     History of Present Illness Kari Villa is a 23 y.o. female with a past medical history of palpitations and migraines who presents today for syncope and tachycardia follow-up.    Patient states she was in her usual state of health until Friday of last week when she went to stand up to look out the window from the couch.  When she stood up she had a syncopal episode and was told that she had spasming of her left side.  She had a recurrent episode on Saturday where she went to stand up and blacked out briefly.  After this, on Monday she had a vomiting episode prompting her to go to the ED.  She was found to have sinus tachycardia in the ED and was given IV fluids and Zofran.  She states she has a history of sinus tachycardia and occasionally has palpitations.  She was told in the past this is likely due to anxiety and possibly due to ehrlichiosis.  Her ehrlichiosis was treated several years ago but she has continued to have tachycardia.  No family history of sudden cardiac death at a young age.  She has not had chest pain.  She does have a family history of premature CAD and cerebrovascular disease.  ROS:  Review of Systems  All other systems reviewed and are negative.   Physical Exam  Physical Exam Vitals and nursing note reviewed.  Constitutional:      Appearance: Normal appearance.  HENT:     Head: Normocephalic and atraumatic.  Eyes:     Conjunctiva/sclera: Conjunctivae normal.  Neck:     Vascular: No carotid bruit.  Cardiovascular:     Rate and Rhythm: Regular rhythm. Tachycardia present.  Pulmonary:     Effort: Pulmonary effort is normal.     Breath sounds: Normal breath sounds.  Musculoskeletal:        General: No swelling or tenderness.  Skin:    Coloration: Skin is not jaundiced  or pale.  Neurological:     Mental Status: She is alert.     VS:  Ht 5' 7 (1.702 m)   Wt 166 lb 12.8 oz (75.7 kg)   SpO2 99%   BMI 26.12 kg/m         Wt Readings from Last 3 Encounters:  12/22/23 166 lb 12.8 oz (75.7 kg)  12/20/23 165 lb (74.8 kg)  09/21/23 164 lb (74.4 kg)     EKG Interpretation Date/Time:    Ventricular Rate:    PR Interval:    QRS Duration:    QT Interval:    QTC Calculation:   R Axis:      Text Interpretation:      Studies Reviewed   Prior CV Studies: LONG TERM MONITOR (3-7 DAYS) INTERPRETATION 07/29/2022  Narrative 45 triggered events for sinus rhythm, sinus tachycardia. No significant tachyarrhythmia or bradyarrhythmia. No atrial fibrillation or flutter.   Patch Wear Time:  3 days and 5 hours (2024-07-01T07:19:05-398 to 2024-07-04T12:54:30-0400)  Patient had a min HR of 69 bpm, max HR of 172 bpm, and avg HR of 99 bpm. Predominant underlying rhythm was Sinus Rhythm. Isolated SVEs were rare (<1.0%), and no SVE Couplets or SVE Triplets were present. Isolated VEs were rare (<1.0%), VE Couplets were rare (<1.0%), and no VE Triplets were  present.      ASCVD risk score: The ASCVD Risk score (Arnett DK, et al., 2019) failed to calculate for the following reasons:   The 2019 ASCVD risk score is only valid for ages 85 to 52   ASSESSMENT/PLAN  Syncope, consistent with orthostatic hypotension syncopal episode leading to her recent ED visit are most consistent with orthostatic hypotension.  Orthostatics were negative but checked after she was given IV fluids.  She was advised to stay well-hydrated and increase her salt intake POTS this is a new diagnosis for her.  Orthostatic vitals today in the office show an increase in heart rate by 34 points without a change in orthostatic blood pressure.  This is consistent with POTS.  She has had a Zio patch in the past which shows sinus tachycardia and symptoms during tachycardic episodes without pathologic  arrhythmias.  She felt sluggish in the past with metoprolol  tartrate.  Will start the patient on propranolol  10 mg 3 times daily and order an echocardiogram to rule out any structural abnormalities.  Advised to stay well-hydrated and consider compression stockings.  Follow up: 3 months          Signed, Norlene Lanes E Kameron Glazebrook, DO

## 2023-12-22 NOTE — Patient Instructions (Signed)
 Medication Instructions:  START Propranolol  10 mg tablet, 3 times a day *If you need a refill on your cardiac medications before your next appointment, please call your pharmacy*  Testing/Procedures: Your physician has requested that you have an echocardiogram. Echocardiography is a painless test that uses sound waves to create images of your heart. It provides your doctor with information about the size and shape of your heart and how well your heart's chambers and valves are working. This procedure takes approximately one hour. There are no restrictions for this procedure. Please do NOT wear cologne, perfume, aftershave, or lotions (deodorant is allowed). Please arrive 15 minutes prior to your appointment time.  Please note: We ask at that you not bring children with you during ultrasound (echo/ vascular) testing. Due to room size and safety concerns, children are not allowed in the ultrasound rooms during exams. Our front office staff cannot provide observation of children in our lobby area while testing is being conducted. An adult accompanying a patient to their appointment will only be allowed in the ultrasound room at the discretion of the ultrasound technician under special circumstances. We apologize for any inconvenience.   Follow-Up: At [facility], you and your health needs are our priority.  As part of our continuing mission to provide you with exceptional heart care, our providers are all part of one team.  This team includes your primary Cardiologist (physician) and Advanced Practice Providers or APPs (Physician Assistants and Nurse Practitioners) who all work together to provide you with the care you need, when you need it.  Your next appointment:   3 month(s)  Provider:   Emeline FORBES Calender, DO    We recommend signing up for the patient portal called MyChart.  Sign up information is provided on this After Visit Summary.  MyChart is used to connect with patients for Virtual  Visits (Telemedicine).  Patients are able to view lab/test results, encounter notes, upcoming appointments, etc.  Non-urgent messages can be sent to your provider as well.   To learn more about what you can do with MyChart, go to [email or web address].   Other Instructions

## 2023-12-23 ENCOUNTER — Other Ambulatory Visit (HOSPITAL_COMMUNITY): Payer: Self-pay

## 2023-12-23 ENCOUNTER — Telehealth: Payer: Self-pay | Admitting: Internal Medicine

## 2023-12-23 MED ORDER — PROPRANOLOL HCL 10 MG PO TABS: 10.0000 mg | ORAL_TABLET | Freq: Three times a day (TID) | ORAL | 1 refills | Status: DC

## 2023-12-23 NOTE — Telephone Encounter (Signed)
Spoke with patient. Prescription sent to pharmacy.

## 2023-12-23 NOTE — Telephone Encounter (Signed)
 Pt c/o medication issue:  1. Name of Medication:   propranolol  (INDERAL ) tablet 10 mg    2. How are you currently taking this medication (dosage and times per day)? As written   3. Are you having a reaction (difficulty breathing--STAT)? No   4. What is your medication issue? Pt called in stating she is supposed to be starting this medication sent yesterday but it was not sent to pharmacy. Please advise.     [id number] - [place], [place] - [address] Phone: [phone]  Fax: [phone]

## 2024-01-14 ENCOUNTER — Other Ambulatory Visit: Payer: Self-pay | Admitting: Internal Medicine

## 2024-01-28 ENCOUNTER — Encounter (HOSPITAL_BASED_OUTPATIENT_CLINIC_OR_DEPARTMENT_OTHER): Payer: Self-pay

## 2024-01-31 ENCOUNTER — Ambulatory Visit: Payer: Self-pay | Admitting: Internal Medicine

## 2024-01-31 ENCOUNTER — Other Ambulatory Visit (INDEPENDENT_AMBULATORY_CARE_PROVIDER_SITE_OTHER)

## 2024-01-31 DIAGNOSIS — R002 Palpitations: Secondary | ICD-10-CM

## 2024-01-31 DIAGNOSIS — G90A Postural orthostatic tachycardia syndrome (POTS): Secondary | ICD-10-CM | POA: Diagnosis not present

## 2024-01-31 LAB — ECHOCARDIOGRAM COMPLETE
Area-P 1/2: 4.39 cm2
S' Lateral: 2.3 cm

## 2024-03-30 ENCOUNTER — Ambulatory Visit: Admitting: Internal Medicine

## 2024-09-21 ENCOUNTER — Ambulatory Visit: Admitting: Nurse Practitioner
